# Patient Record
Sex: Male | Born: 1985 | Race: White | Hispanic: No | Marital: Single | Smoking: Current every day smoker
Health system: Southern US, Community
[De-identification: ages and names within clinical notes are randomized; demographics above are authoritative.]

## PROBLEM LIST (undated history)

## (undated) HISTORY — PX: BLADDER SURGERY: SHX569

---

## 2013-06-05 ENCOUNTER — Emergency Department (HOSPITAL_BASED_OUTPATIENT_CLINIC_OR_DEPARTMENT_OTHER): Payer: 59

## 2013-06-05 ENCOUNTER — Encounter (HOSPITAL_BASED_OUTPATIENT_CLINIC_OR_DEPARTMENT_OTHER): Payer: Self-pay | Admitting: Emergency Medicine

## 2013-06-05 ENCOUNTER — Emergency Department (HOSPITAL_BASED_OUTPATIENT_CLINIC_OR_DEPARTMENT_OTHER)
Admission: EM | Admit: 2013-06-05 | Discharge: 2013-06-05 | Disposition: A | Discharge status: Home / Self Care | Payer: 59 | Attending: Emergency Medicine | Admitting: Emergency Medicine

## 2013-06-05 DIAGNOSIS — R079 Chest pain, unspecified: Secondary | ICD-10-CM | POA: Insufficient documentation

## 2013-06-05 DIAGNOSIS — R634 Abnormal weight loss: Secondary | ICD-10-CM | POA: Insufficient documentation

## 2013-06-05 DIAGNOSIS — M545 Low back pain, unspecified: Secondary | ICD-10-CM | POA: Insufficient documentation

## 2013-06-05 DIAGNOSIS — F411 Generalized anxiety disorder: Secondary | ICD-10-CM | POA: Insufficient documentation

## 2013-06-05 DIAGNOSIS — R5383 Other fatigue: Secondary | ICD-10-CM

## 2013-06-05 DIAGNOSIS — F419 Anxiety disorder, unspecified: Secondary | ICD-10-CM

## 2013-06-05 DIAGNOSIS — F17200 Nicotine dependence, unspecified, uncomplicated: Secondary | ICD-10-CM | POA: Insufficient documentation

## 2013-06-05 DIAGNOSIS — R5381 Other malaise: Secondary | ICD-10-CM | POA: Insufficient documentation

## 2013-06-05 DIAGNOSIS — M549 Dorsalgia, unspecified: Secondary | ICD-10-CM

## 2013-06-05 DIAGNOSIS — R51 Headache: Secondary | ICD-10-CM | POA: Insufficient documentation

## 2013-06-05 LAB — CBC WITH DIFFERENTIAL/PLATELET
BASOS ABS: 0 10*3/uL (ref 0.0–0.1)
Basophils Relative: 0 % (ref 0–1)
EOS ABS: 0.2 10*3/uL (ref 0.0–0.7)
Eosinophils Relative: 2 % (ref 0–5)
HCT: 44.1 % (ref 39.0–52.0)
Hemoglobin: 15.5 g/dL (ref 13.0–17.0)
LYMPHS PCT: 20 % (ref 12–46)
Lymphs Abs: 1.7 10*3/uL (ref 0.7–4.0)
MCH: 30.7 pg (ref 26.0–34.0)
MCHC: 35.1 g/dL (ref 30.0–36.0)
MCV: 87.3 fL (ref 78.0–100.0)
Monocytes Absolute: 0.6 10*3/uL (ref 0.1–1.0)
Monocytes Relative: 7 % (ref 3–12)
Neutro Abs: 6.1 10*3/uL (ref 1.7–7.7)
Neutrophils Relative %: 71 % (ref 43–77)
PLATELETS: 223 10*3/uL (ref 150–400)
RBC: 5.05 MIL/uL (ref 4.22–5.81)
RDW: 13.7 % (ref 11.5–15.5)
WBC: 8.6 10*3/uL (ref 4.0–10.5)

## 2013-06-05 LAB — URINALYSIS, ROUTINE W REFLEX MICROSCOPIC
BILIRUBIN URINE: NEGATIVE
Glucose, UA: NEGATIVE mg/dL
HGB URINE DIPSTICK: NEGATIVE
Ketones, ur: NEGATIVE mg/dL
Leukocytes, UA: NEGATIVE
Nitrite: NEGATIVE
PH: 6.5 (ref 5.0–8.0)
Protein, ur: NEGATIVE mg/dL
SPECIFIC GRAVITY, URINE: 1.029 (ref 1.005–1.030)
Urobilinogen, UA: 1 mg/dL (ref 0.0–1.0)

## 2013-06-05 LAB — COMPREHENSIVE METABOLIC PANEL
ALT: 17 U/L (ref 0–53)
AST: 19 U/L (ref 0–37)
Albumin: 4.7 g/dL (ref 3.5–5.2)
Alkaline Phosphatase: 57 U/L (ref 39–117)
BUN: 18 mg/dL (ref 6–23)
CO2: 27 mEq/L (ref 19–32)
Calcium: 9.7 mg/dL (ref 8.4–10.5)
Chloride: 102 mEq/L (ref 96–112)
Creatinine, Ser: 0.9 mg/dL (ref 0.50–1.35)
GFR calc Af Amer: >90 mL/min (ref >90)
GFR calc non Af Amer: >90 mL/min (ref >90)
Glucose, Bld: 86 mg/dL (ref 70–99)
Potassium: 4.5 mEq/L (ref 3.7–5.3)
SODIUM: 141 meq/L (ref 137–147)
TOTAL PROTEIN: 7.6 g/dL (ref 6.0–8.3)
Total Bilirubin: 0.5 mg/dL (ref 0.3–1.2)

## 2013-06-05 MED ORDER — TRAMADOL HCL 50 MG PO TABS: 50.0000 mg | ORAL_TABLET | Freq: Four times a day (QID) | ORAL | Status: AC | PRN

## 2013-06-05 NOTE — ED Provider Notes (Signed)
CSN: [id number]     Arrival date & time 06/05/13  1613 History   First MD Initiated Contact with Patient 06/05/13 1743     Chief Complaint  Patient presents with  . Back Pain     (Consider location/radiation/quality/duration/timing/severity/associated sxs/prior Treatment) HPI Comments: Patient presents multiple complaints. He states for the last 2- 3 months she's had some persistent pain in his low back. It's worse with standing. Also has some generalized fatigue. He says his appetite is gone away and he can only eat about once a day. However he has gained about 20 pounds over the last few months. He's had some intermittent chest pains and headaches. He does feel like he's anxious and has a lot of stress. He denies any significant depression or suicidal ideations. He denies any shortness of breath. He denies any known injuries to his back. He denies any fevers or night sweats. He did have cough a few weeks ago that had small tinges of blood but he hasn't had any recent hemoptysis or shortness of breath.  Patient is a 28 y.o. male presenting with back pain.  Back Pain Associated symptoms: chest pain and headaches   Associated symptoms: no abdominal pain, no fever, no numbness and no weakness     History reviewed. No pertinent past medical history. Past Surgical History  Procedure Laterality Date  . Bladder surgery     History reviewed. No pertinent family history. History  Substance Use Topics  . Smoking status: Current Every Day Smoker -- 0.50 packs/day    Types: Cigarettes  . Smokeless tobacco: Not on file  . Alcohol Use: No    Review of Systems  Constitutional: Positive for activity change, appetite change, fatigue and unexpected weight change (has gained about 20 pounds over last few months). Negative for fever, chills and diaphoresis.  HENT: Negative for congestion, rhinorrhea and sneezing.   Eyes: Negative.   Respiratory: Negative for cough, chest tightness and shortness of  breath.   Cardiovascular: Positive for chest pain. Negative for leg swelling.  Gastrointestinal: Negative for nausea, vomiting, abdominal pain, diarrhea and blood in stool.  Genitourinary: Negative for frequency, hematuria, flank pain and difficulty urinating.  Musculoskeletal: Positive for back pain. Negative for arthralgias.  Skin: Negative for rash.  Neurological: Positive for headaches. Negative for dizziness, speech difficulty, weakness and numbness.  Hematological: Negative for adenopathy. Does not bruise/bleed easily.  Psychiatric/Behavioral: Negative for suicidal ideas. The patient is nervous/anxious.       Allergies  Review of patient's allergies indicates no known allergies.  Home Medications   Prior to Admission medications   Not on File   BP 135/83  Pulse 83  Temp(Src) 98 F (36.7 C) (Oral)  Resp 18  Ht 5\' 5"  (1.651 m)  Wt 155 lb (70.308 kg)  BMI 25.79 kg/m2  SpO2 97% Physical Exam  Constitutional: He is oriented to person, place, and time. He appears well-developed and well-nourished.  HENT:  Head: Normocephalic and atraumatic.  Eyes: Pupils are equal, round, and reactive to light.  Neck: Normal range of motion. Neck supple.  Cardiovascular: Normal rate, regular rhythm and normal heart sounds.   Pulmonary/Chest: Effort normal and breath sounds normal. No respiratory distress. He has no wheezes. He has no rales. He exhibits no tenderness.  Abdominal: Soft. Bowel sounds are normal. There is no tenderness. There is no rebound and no guarding.  Musculoskeletal: Normal range of motion. He exhibits no edema.  Mild tenderness to the upper lumbar spine. No step-offs or  deformities.  Lymphadenopathy:    He has no cervical adenopathy.  Neurological: He is alert and oriented to person, place, and time.  Motor function and sensation is intact in the lower extremities  Skin: Skin is warm and dry. No rash noted.  Psychiatric: He has a normal mood and affect.    ED  Course  Procedures (including critical care time) Labs Review Results for orders placed during the hospital encounter of 06/05/13  CBC WITH DIFFERENTIAL      Result Value Ref Range   WBC 8.6  4.0 - 10.5 K/uL   RBC 5.05  4.22 - 5.81 MIL/uL   Hemoglobin 15.5  13.0 - 17.0 g/dL   HCT 16.144.1  09.639.0 - 04.552.0 %   MCV 87.3  78.0 - 100.0 fL   MCH 30.7  26.0 - 34.0 pg   MCHC 35.1  30.0 - 36.0 g/dL   RDW 40.913.7  81.111.5 - 91.415.5 %   Platelets 223  150 - 400 K/uL   Neutrophils Relative % 71  43 - 77 %   Neutro Abs 6.1  1.7 - 7.7 K/uL   Lymphocytes Relative 20  12 - 46 %   Lymphs Abs 1.7  0.7 - 4.0 K/uL   Monocytes Relative 7  3 - 12 %   Monocytes Absolute 0.6  0.1 - 1.0 K/uL   Eosinophils Relative 2  0 - 5 %   Eosinophils Absolute 0.2  0.0 - 0.7 K/uL   Basophils Relative 0  0 - 1 %   Basophils Absolute 0.0  0.0 - 0.1 K/uL  COMPREHENSIVE METABOLIC PANEL      Result Value Ref Range   Sodium 141  137 - 147 mEq/L   Potassium 4.5  3.7 - 5.3 mEq/L   Chloride 102  96 - 112 mEq/L   CO2 27  19 - 32 mEq/L   Glucose, Bld 86  70 - 99 mg/dL   BUN 18  6 - 23 mg/dL   Creatinine, Ser 7.820.90  0.50 - 1.35 mg/dL   Calcium 9.7  8.4 - 95.610.5 mg/dL   Total Protein 7.6  6.0 - 8.3 g/dL   Albumin 4.7  3.5 - 5.2 g/dL   AST 19  0 - 37 U/L   ALT 17  0 - 53 U/L   Alkaline Phosphatase 57  39 - 117 U/L   Total Bilirubin 0.5  0.3 - 1.2 mg/dL   GFR calc non Af Amer >90  >90 mL/min   GFR calc Af Amer >90  >90 mL/min  URINALYSIS, ROUTINE W REFLEX MICROSCOPIC      Result Value Ref Range   Color, Urine YELLOW  YELLOW   APPearance CLEAR  CLEAR   Specific Gravity, Urine 1.029  1.005 - 1.030   pH 6.5  5.0 - 8.0   Glucose, UA NEGATIVE  NEGATIVE mg/dL   Hgb urine dipstick NEGATIVE  NEGATIVE   Bilirubin Urine NEGATIVE  NEGATIVE   Ketones, ur NEGATIVE  NEGATIVE mg/dL   Protein, ur NEGATIVE  NEGATIVE mg/dL   Urobilinogen, UA 1.0  0.0 - 1.0 mg/dL   Nitrite NEGATIVE  NEGATIVE   Leukocytes, UA NEGATIVE  NEGATIVE   Dg Chest 2  View  06/05/2013   CLINICAL DATA:  Cough and intermittent chest pain.  EXAM: CHEST  2 VIEW  COMPARISON:  None.  FINDINGS: The heart size and mediastinal contours are within normal limits. Both lungs are clear. The visualized skeletal structures are unremarkable.  IMPRESSION: Normal chest  radiographs   Electronically Signed   By: Amie Portland M.D.   On: 06/05/2013 18:53   Dg Lumbar Spine Complete  06/05/2013   CLINICAL DATA:  Low back pain.  EXAM: LUMBAR SPINE - COMPLETE 4+ VIEW  COMPARISON:  None.  FINDINGS: There is no evidence of lumbar spine fracture. Alignment is normal. Intervertebral disc spaces are maintained.  IMPRESSION: Normal exam.   Electronically Signed   By: Drusilla Kanner M.D.   On: 06/05/2013 18:43     Imaging Review Dg Chest 2 View  06/05/2013   CLINICAL DATA:  Cough and intermittent chest pain.  EXAM: CHEST  2 VIEW  COMPARISON:  None.  FINDINGS: The heart size and mediastinal contours are within normal limits. Both lungs are clear. The visualized skeletal structures are unremarkable.  IMPRESSION: Normal chest radiographs   Electronically Signed   By: Amie Portland M.D.   On: 06/05/2013 18:53   Dg Lumbar Spine Complete  06/05/2013   CLINICAL DATA:  Low back pain.  EXAM: LUMBAR SPINE - COMPLETE 4+ VIEW  COMPARISON:  None.  FINDINGS: There is no evidence of lumbar spine fracture. Alignment is normal. Intervertebral disc spaces are maintained.  IMPRESSION: Normal exam.   Electronically Signed   By: Drusilla Kanner M.D.   On: 06/05/2013 18:43     EKG Interpretation   Date/Time:  Wednesday June 05 2013 18:13:02 EDT Ventricular Rate:  72 PR Interval:  130 QRS Duration: 96 QT Interval:  364 QTC Calculation: 398 R Axis:   73 Text Interpretation:  Normal sinus rhythm with sinus arrhythmia Normal ECG  No old tracing to compare Confirmed by Demonte Dobratz  MD, Maleko Greulich ([id number]) on  06/05/2013 7:35:22 PM      MDM   Final diagnoses:  Back pain  Anxiety    Patient's labs are  unremarkable. He has no neurologic dysfunctions or signs of cauda equina. He has multiple complaints and I feel that this is largely anxiety induced. I will give him a referral to Dr. Pearletha Forge regarding his back pain. Ultram prescription for Ultram. Ice did not prescribe him NSAIDs as he seems to have some GERD symptoms and possible peptic ulcer disease. I also gave him outpatient resources for psychiatric followup. At this point he denies any SI or HI.    Rolan Bucco, MD 06/05/13 [phone]

## 2013-06-05 NOTE — ED Notes (Signed)
MD at bedside. 

## 2013-06-05 NOTE — Discharge Instructions (Signed)
Back Pain, Adult °Low back pain is very common. About 1 in 5 people have back pain. The cause of low back pain is rarely dangerous. The pain often gets better over time. About half of people with a sudden onset of back pain feel better in just 2 weeks. About 8 in 10 people feel better by 6 weeks.  °CAUSES °Some common causes of back pain include: °· Strain of the muscles or ligaments supporting the spine. °· Wear and tear (degeneration) of the spinal discs. °· Arthritis. °· Direct injury to the back. °DIAGNOSIS °Most of the time, the direct cause of low back pain is not known. However, back pain can be treated effectively even when the exact cause of the pain is unknown. Answering your caregiver's questions about your overall health and symptoms is one of the most accurate ways to make sure the cause of your pain is not dangerous. If your caregiver needs more information, he or she may order lab work or imaging tests (X-rays or MRIs). However, even if imaging tests show changes in your back, this usually does not require surgery. °HOME CARE INSTRUCTIONS °For many people, back pain returns. Since low back pain is rarely dangerous, it is often a condition that people can learn to manage on their own.  °· Remain active. It is stressful on the back to sit or stand in one place. Do not sit, drive, or stand in one place for more than 30 minutes at a time. Take short walks on level surfaces as soon as pain allows. Try to increase the length of time you walk each day. °· Do not stay in bed. Resting more than 1 or 2 days can delay your recovery. °· Do not avoid exercise or work. Your body is made to move. It is not dangerous to be active, even though your back may hurt. Your back will likely heal faster if you return to being active before your pain is gone. °· Pay attention to your body when you  bend and lift. Many people have less discomfort when lifting if they bend their knees, keep the load close to their bodies, and  avoid twisting. Often, the most comfortable positions are those that put less stress on your recovering back. °· Find a comfortable position to sleep. Use a firm mattress and lie on your side with your knees slightly bent. If you lie on your back, put a pillow under your knees. °· Only take over-the-counter or prescription medicines as directed by your caregiver. Over-the-counter medicines to reduce pain and inflammation are often the most helpful. Your caregiver may prescribe muscle relaxant drugs. These medicines help dull your pain so you can more quickly return to your normal activities and healthy exercise. °· Put ice on the injured area. °· Put ice in a plastic bag. °· Place a towel between your skin and the bag. °· Leave the ice on for 15-20 minutes, 03-04 times a day for the first 2 to 3 days. After that, ice and heat may be alternated to reduce pain and spasms. °· Ask your caregiver about trying back exercises and gentle massage. This may be of some benefit. °· Avoid feeling anxious or stressed. Stress increases muscle tension and can worsen back pain. It is important to recognize when you are anxious or stressed and learn ways to manage it. Exercise is a great option. °SEEK MEDICAL CARE IF: °· You have pain that is not relieved with rest or medicine. °· You have pain that does not improve in 1 week. °· You have new symptoms. °· You are generally not feeling well. °SEEK   IMMEDIATE MEDICAL CARE IF:  °· You have pain that radiates from your back into your legs. °· You develop new bowel or bladder control problems. °· You have unusual weakness or numbness in your arms or legs. °· You develop nausea or vomiting. °· You develop abdominal pain. °· You feel faint. °Document Released: 12/20/2004 Document Revised: 06/21/2011 Document Reviewed: 05/10/2010 °ExitCare® Patient Information ©2014 [employer]. ° ° °Emergency Department Resource Guide °1) Find a Doctor and Pay Out of Pocket °Although you won't have to find  out who is covered by your insurance plan, it is a good idea to ask around and get recommendations. You will then need to call the office and see if the doctor you have chosen will accept you as a new patient and what types of options they offer for patients who are self-pay. Some doctors offer discounts or will set up payment plans for their patients who do not have insurance, but you will need to ask so you aren't surprised when you get to your appointment. ° °2) Contact Your Local Health Department °Not all health departments have doctors that can see patients for sick visits, but many do, so it is worth a call to see if yours does. If you don't know where your local health department is, you can check in your phone book. The CDC also has a tool to help you locate your state's health department, and many state websites also have listings of all of their local health departments. ° °3) Find a Walk-in Clinic °If your illness is not likely to be very severe or complicated, you may want to try a walk in clinic. These are popping up all over the country in pharmacies, drugstores, and shopping centers. They're usually staffed by nurse practitioners or physician assistants that have been trained to treat common illnesses and complaints. They're usually fairly quick and inexpensive. However, if you have serious medical issues or chronic medical problems, these are probably not your best option. ° °No Primary Care Doctor: °- Call [facility] at  832-8000 - they can help you locate a primary care doctor that  accepts your insurance, provides certain services, etc. °- Physician Referral Service- 1-800-533-3463 ° °Chronic Pain Problems: °Organization         Address  Phone   Notes  °[facility]  (336) 297-2271 Patients need to be referred by their primary care doctor.  ° °Medication Assistance: °Organization         Address  Phone   Notes  °[place] [facility] [address] °[place], [place] [address] (336) 641-8030 --Must be a resident of [place] °-- Must have NO insurance coverage whatsoever (no Medicaid/ Medicare, etc.) °-- The pt. MUST have a primary care doctor that directs their care regularly and follows them in the community °  °[facility]  (866) 331-1348   °[employer]  (888) 892-1162   ° °Agencies that provide inexpensive medical care: °Organization         Address  Phone   Notes  °[facility]  (336) 832-8035   °[facility]    (336) 832-7272   °[facility] [address] °[place], [place] [address] (336) 832-4777   °[facility] [address], °[place] (336) 271-4999   °[facility]    (336) 373-0678   °[facility]    (336) 272-1050   °[facility] °   [address], [place] Phone:  (336) 832-4444, Fax:  (336) 832-4440 Hours of Operation:  9 am - 6 pm, M-F.  Also accepts Medicaid/Medicare and self-pay.  ° [facility] ° [address], [place] Phone: (336) 832-3150, Fax: (336) 832-3151. Hours of Operation:  8:30 am - 5:30 pm, M-F.  Also accepts Medicaid and self-pay.  °[facility] [place] [address], [place] Phone: (336) 878-6027   °[facility] [address], [place], [place] (336)723-1848, Ext. 123 Mondays & Thursdays: 7-9 AM.  First 15 patients are seen on a first come, first serve basis. °  ° °Medicaid-accepting [place] Providers: ° °Organization         Address  Phone   Notes  °[facility] [address], [place] (336) 641-2100 Also accepts self-pay patients.  °[facility] [address], [place] ° (336) 856-9996   °[facility] [address], [place] (336) 288-8857   °[facility] [address] [place] [address], [place] (336) 299-7000   °Veita Bland  [address], [place]  ° (336) 373-1557 Only accepts [place] Access Medicaid patients after they have their name applied to their card.  ° °Self-Pay (no insurance) in [place]: ° °Organization         Address  Phone   Notes  °Sickle Cell Patients, [facility] [address], [place] (336) 832-1970   °[facility] [address], [place] (336) 832-4400   °[facility] ° [address], [place] (336) 992-4800   °[facility]/Dr. Osei-Bonsu ° [address] [place] [address], [place] or [address], [place] (336) [phone] Phone number for both [place] and [place] locations is the same.  °[facility] [address], [place] (336) 299-0000   °[facility] [address] [place] [address], [place] or [address] (336) 852-7530 °(336) 878-2260   °[facility] [address], [place] (336) 350-1642, phone; (336) 294-5005, fax Sees patients 1st and 3rd Saturday of every month.  Must not qualify for public or private insurance (i.e. Medicaid, Medicare, [place] Health Choice, Veterans' Benefits) • Household income should be no more than 200% of the poverty level •The clinic cannot treat you if you are pregnant or think you are pregnant • Sexually transmitted diseases are not treated at the clinic.  ° ° °Dental Care: °Organization         Address  Phone  Notes  °[facility] [address], [place] (336) 641-6152 Accepts children up to age 21 who are enrolled in Medicaid or [place] Health Choice; pregnant women with a Medicaid card; and children who have applied for Medicaid or [place] Health Choice, but were declined, whose parents can pay a reduced fee at time of service.  °[facility] [place]  [address], [place] (336) 641-7733 Accepts children up to age 21  who are enrolled in Medicaid or [place] Health Choice; pregnant women with a Medicaid card; and children who have applied for Medicaid or [place] Health Choice, but were declined, whose parents can pay a reduced fee at time of service.  °[facility] ° [address], [place] (336) 641-4533   Patients are seen by appointment only. Walk-ins are not accepted. [facility] will see patients 18 years of age and older. °Monday - Tuesday (8am-5pm) °Most Wednesdays (8:30-5pm) °$30 per visit, cash only  °[facility] ° [address], [place] (336) 641-4533 Patients are seen by appointment only. Walk-ins are not accepted. [facility] will see patients 18 years of age and older. °One Wednesday Evening (Monthly: Volunteer Based).  $30 per visit, cash only  °[facility]  (919) [phone] for adults; Children under age 4, call Graduate Pediatric Dentistry at ([phone]) [phone]. Children aged 4-14, please call ([phone]) [phone] to request a pediatric application. ° Dental services are provided in all areas of dental care including fillings, crowns and bridges, complete and partial dentures, implants, gum treatment, root canals, and extractions. Preventive care is also provided. Treatment is provided to both adults and children. °Patients are selected via a lottery and there is often a waiting list. °  °[facility] [address], °[place] ° (336) 763-8833 [email or web address] °  °[facility] [address], [place], [place] (336)723-1848, Ext. 123 Second and Fourth Thursday of each month, opens at 6:30 AM; Clinic ends at 9 AM.  Patients are seen on a first-come first-served basis, and a limited number are seen during each clinic.  ° °[facility] ° [address], [place], [place] (336) 723-7904   Eligibility Requirements °You must have lived in [place], [place], or [place] counties for at least the last three months. °   You cannot be eligible for state or federal sponsored healthcare insurance, including Veterans Administration, Medicaid, or Medicare. °  You generally cannot be eligible for healthcare insurance through your employer.  °  How to apply: °Eligibility screenings are held every Tuesday and Wednesday afternoon from 1:00 pm until 4:00 pm. You do not need an appointment for the interview!  °[facility] [address], [place], [place] 336-631-2330   °[place] [facility]  336-342-8273   °[place]  336-703-3100   ° [facility]  336-570-6415   ° °Behavioral Health Resources in the Community: °Intensive Outpatient Programs °Organization         Address  Phone  Notes  °[facility] [address], [place], [place] 336-878-6098   °[facility] [address], [place], [place] 336-832-9800   °[facility] [address], [place], [place] ° 336-882-2125   °[facility] [address],  °[place], [place] 1-800-853-5163 or 336-641-4981   °Substance Abuse Resources °Organization         Address  Phone  Notes  °[facility]  336-882-2125   °[facility]  336-784-9470   °[facility]  336-285-9073   °[facility]  336-845-3988   °Residential & Outpatient Substance Abuse Program  1-800-659-3381   °Psychological Services °Organization         Address  Phone  Notes  °[facility]  336- 832-9600   °[facility]  336- 378-7881   °[facility] [address], [place] 1-800-853-5163 or 336-641-4981   ° °Mobile Crisis Teams °Organization         Address  Phone  Notes  °[facility], Mobile Crisis Care Unit  1-877-626-1772   °Assertive °[facility] ° [address] [place], [place] 336-834-9664   °Sharon DeEsch [address] °  [place] 336-554-5454   ° °Self-Help/Support  Groups °Organization         Address  Phone             Notes  °[facility] [place] - variety of support groups  336- 373-1402 Call for more information  °Narcotics Anonymous (NA), [facility] [address], °[place]  2 meetings at this location  ° °Residential Treatment Programs °Organization         Address  Phone  Notes  °[facility] [address],    °[place]  1-866-801-8205   °[facility] ° [address], [place], [place] 704-293-8524   °[facility] [address], [place] 336-845-3988 Admissions: 8am-3pm M-F  °[facility] [address]    °[place], [place] 336-841-1104   °[facility] [address], [place], [place] 336-379-7146   °[facility] [address]  °[place], [place] 336-285-9073   °[facility] - Intensive Outpatient [address], [place], [place] 336-852-3033   °[facility] [address]  °[place], [place] 1-877-615-2722 or 336-784-9470   °[facility] [address] [place], [place] 336-227-7417 Accepts Medicaid  °[facility] [address]  °[place] 1-800-659-3381 Substance Abuse/Addiction Treatment  ° °[place] Behavioral Health Resources °Organization         Address  Phone  Notes  °[facility]  (888) 581-9988   °Julie Brannon, PhD [address] [place], [place]   (336) 349-5553 or (336) 951-0000   ° [facility]al   [address] °[place], [place] (336) 349-4454   °[facility] [address], [place], [place] (336) 342-8316 Insurance/Medicaid/sponsorship through [facility]  °[facility] [address]                                    [place], [place] (336) 342-8316 Therapy/tele-psych/case  °[facility] [address]  ° [place], [place] (336) 349-2233    °Dr. Arfeen  (336) 349-4544   °[facility] [place]  [facility]  [employer] [facility] [place] [facility] 1) [address], [place] °2) [address], [place] °3)  [address], [place] (336) 349-3220 °(336) 342-7768 ° °(336) 342-8140   °[place] [facility] (336) 342-1394 or (336) 342-3537 (After Hours)    ° ° ° °

## 2013-06-05 NOTE — ED Notes (Signed)
Pt c/o lower back pain x 3 months with generalized fatigue

## 2013-06-11 ENCOUNTER — Ambulatory Visit: Payer: 59 | Admitting: Family Medicine

## 2015-03-05 IMAGING — CR DG LUMBAR SPINE COMPLETE 4+V
5 series · 5 of 5 positions shown · non-contrast
Comparison: None.

CLINICAL DATA: Low back pain.

EXAM:
LUMBAR SPINE - COMPLETE 4+ VIEW

[t l-spine a.p.]
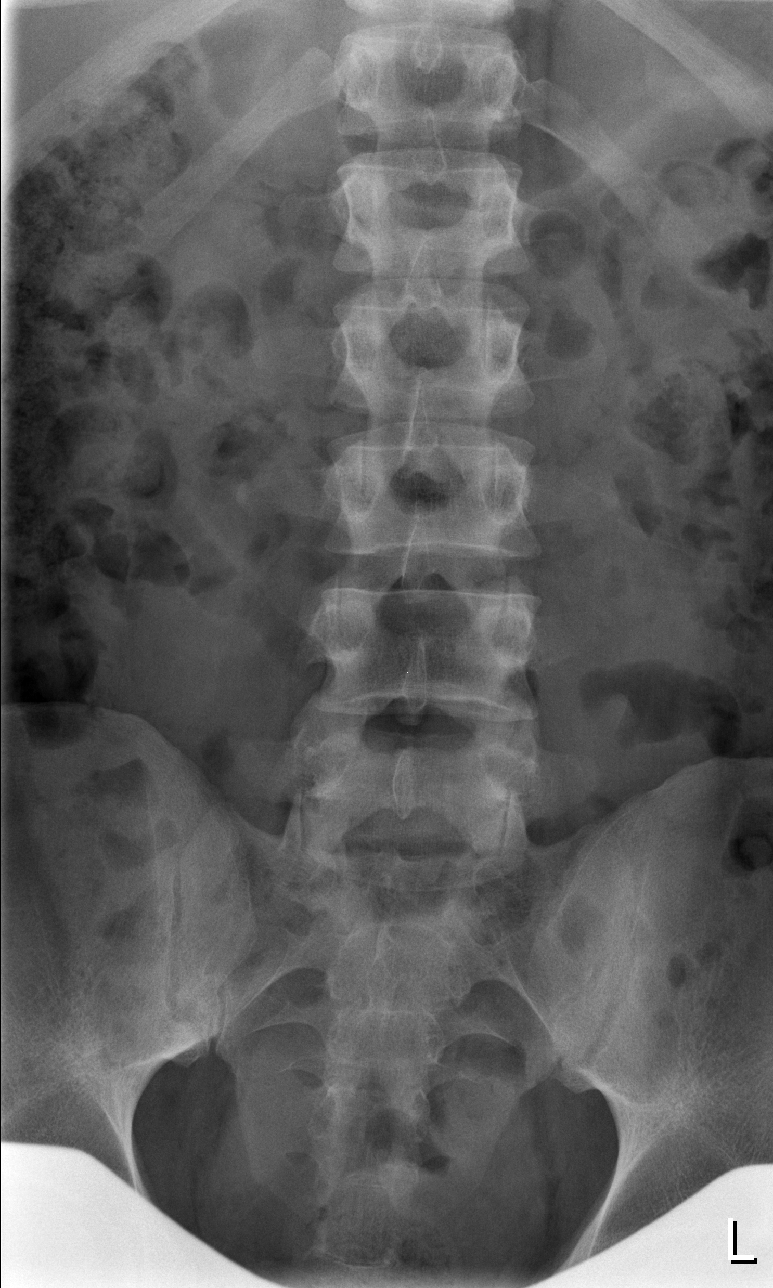

[t l-spine oblique exposure (1 of 2)]
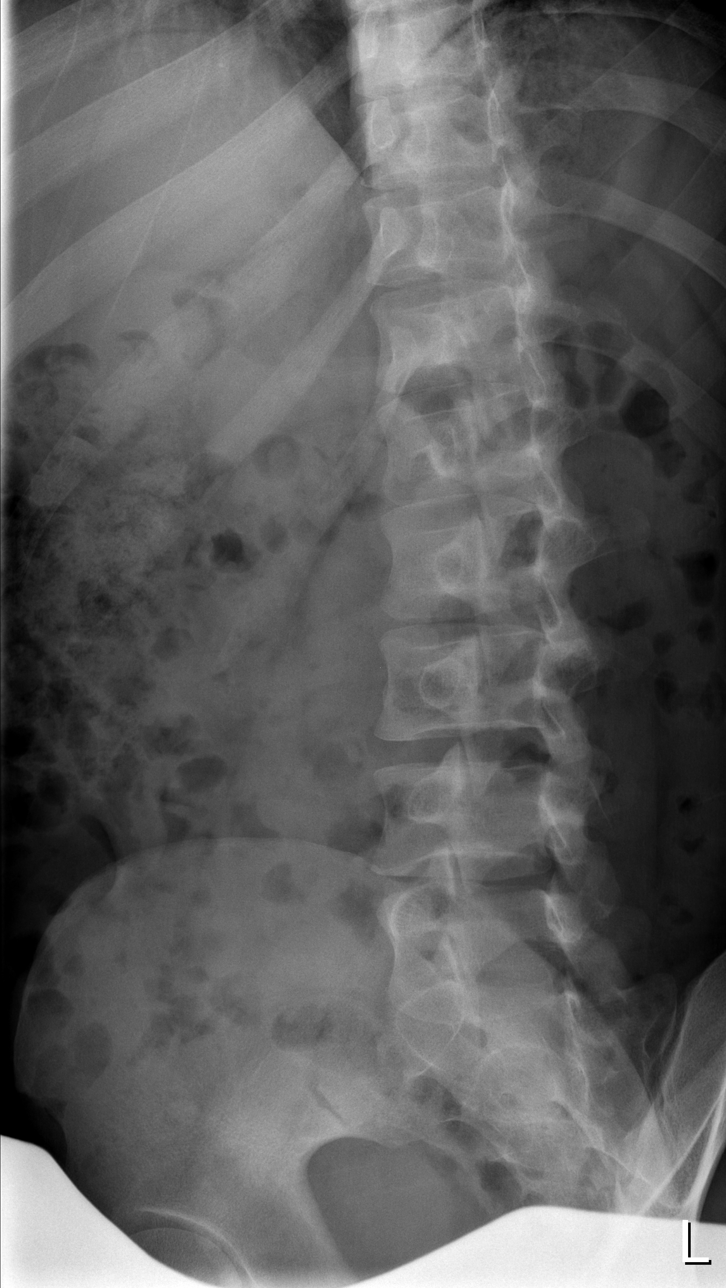

[t l-spine oblique exposure (2 of 2)]
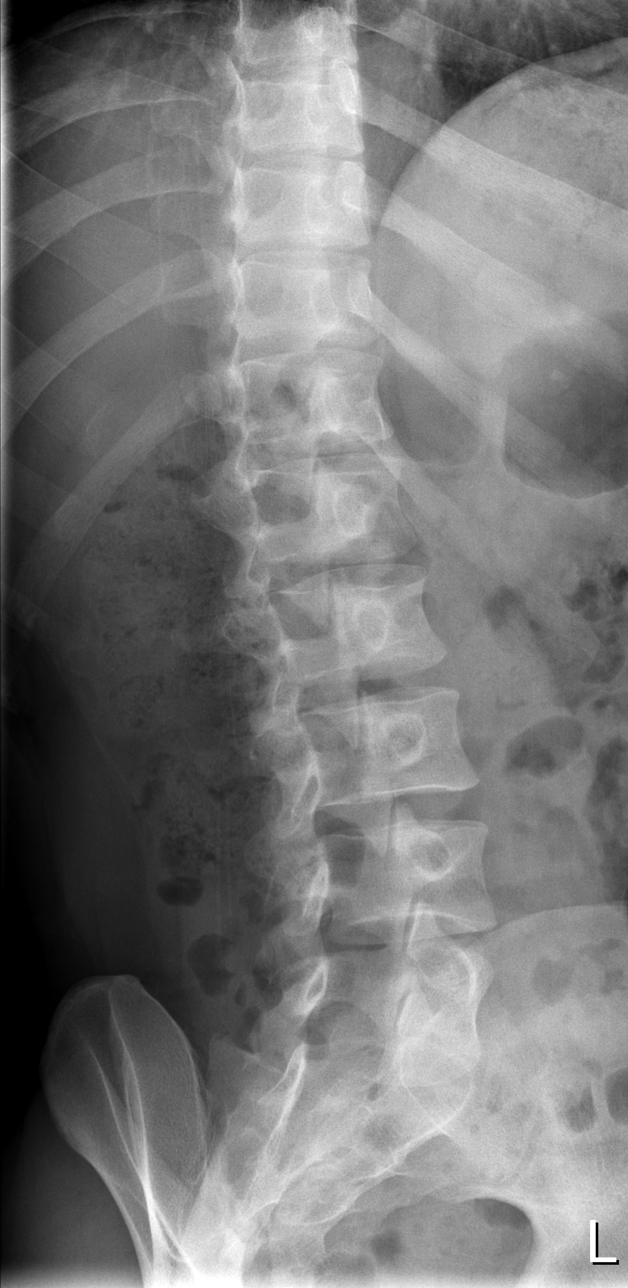

[t l-spine lat]
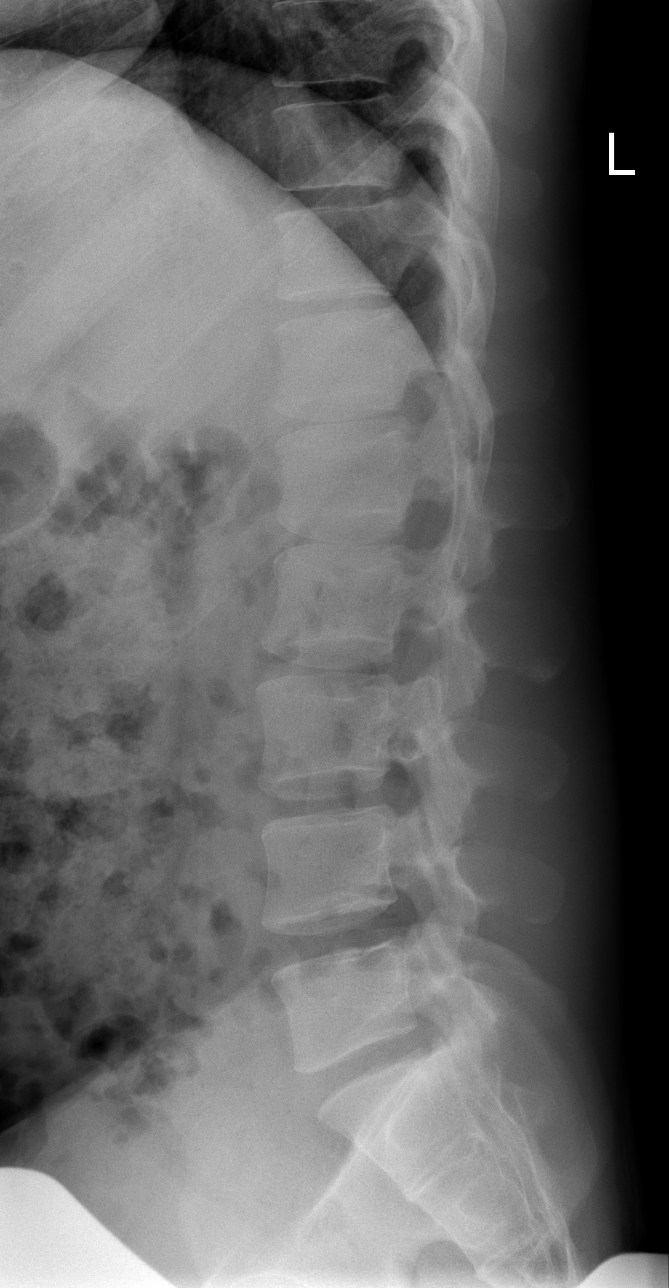

[t l-spine l5-s1 spot]
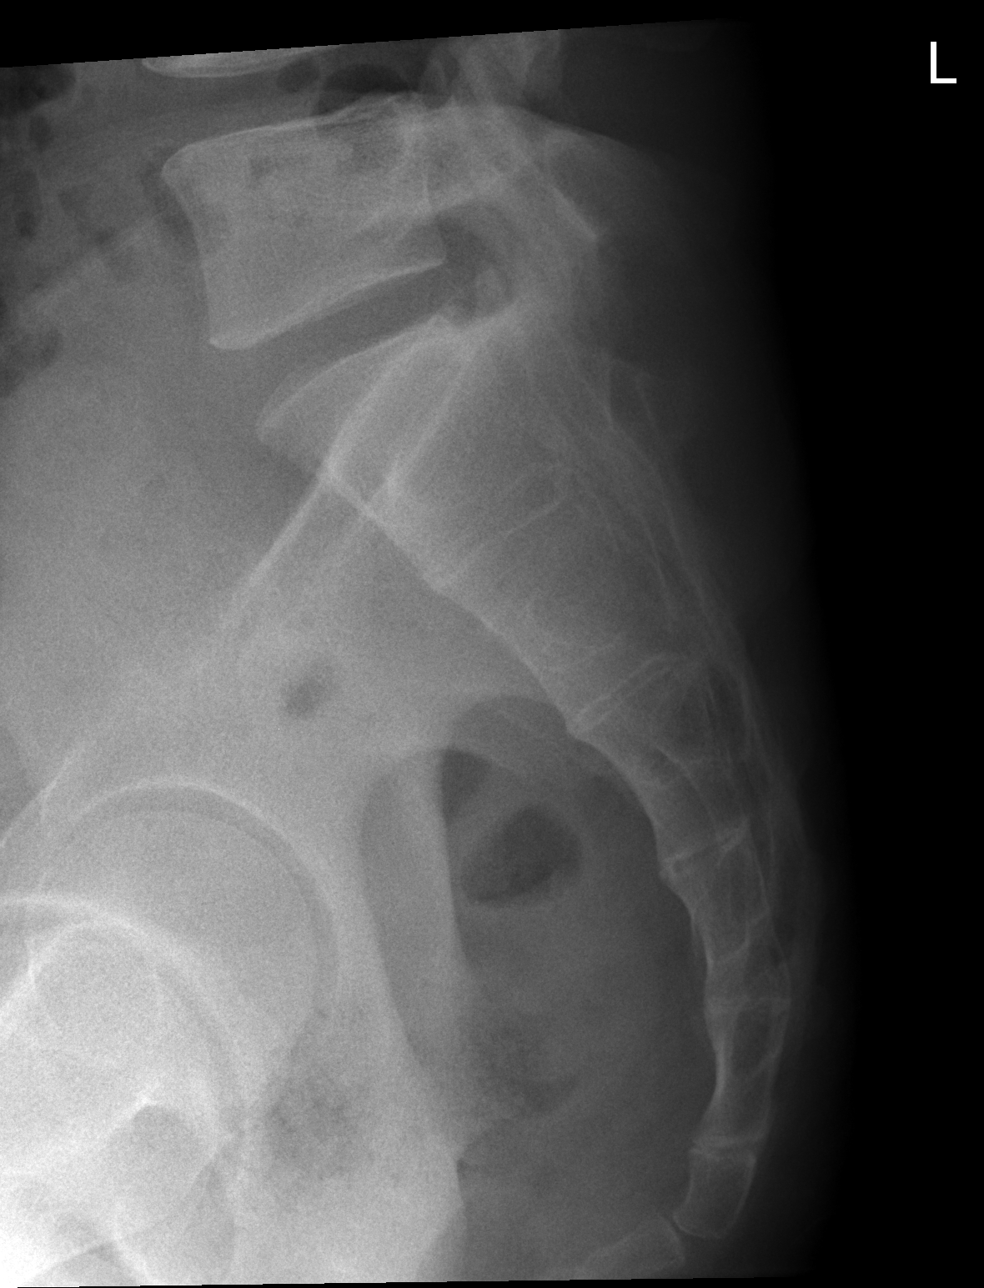

[5 of 5 positions shown; findings below may reference images not displayed]

FINDINGS: There is no evidence of lumbar spine fracture. Alignment is normal.
Intervertebral disc spaces are maintained.
IMPRESSION: Normal exam.
